# Patient Record
Sex: Female | Born: 1990 | Race: White | Hispanic: No | Marital: Single | Smoking: Never smoker
Health system: Southern US, Community
[De-identification: ages and names within clinical notes are randomized; demographics above are authoritative.]

## PROBLEM LIST (undated history)

## (undated) HISTORY — PX: TYMPANOSTOMY TUBE PLACEMENT: SHX32

---

## 2012-06-10 ENCOUNTER — Emergency Department
Admission: EM | Admit: 2012-06-10 | Discharge: 2012-06-10 | Disposition: A | Discharge status: Home / Self Care | Payer: BC Managed Care – PPO | Source: Home / Self Care | Attending: Family Medicine | Admitting: Family Medicine

## 2012-06-10 ENCOUNTER — Encounter: Payer: Self-pay | Admitting: *Deleted

## 2012-06-10 ENCOUNTER — Emergency Department (INDEPENDENT_AMBULATORY_CARE_PROVIDER_SITE_OTHER): Payer: BC Managed Care – PPO

## 2012-06-10 DIAGNOSIS — R51 Headache: Secondary | ICD-10-CM

## 2012-06-10 MED ORDER — BUTALBITAL-APAP-CAFFEINE 50-325-40 MG PO TABS: 1.0000 | ORAL_TABLET | Freq: Four times a day (QID) | ORAL | Status: DC | PRN

## 2012-06-10 MED ORDER — PROMETHAZINE HCL 25 MG PO TABS: 25.0000 mg | ORAL_TABLET | Freq: Four times a day (QID) | ORAL | Status: AC | PRN

## 2012-06-10 MED ORDER — KETOROLAC TROMETHAMINE 60 MG/2ML IM SOLN
60.0000 mg | Freq: Once | INTRAMUSCULAR | Status: AC
  Administered 2012-06-10: 60 mg via INTRAMUSCULAR

## 2012-06-10 NOTE — ED Provider Notes (Signed)
History     CSN: [id number]  Arrival date & time 06/10/12  1152   First MD Initiated Contact with Patient 06/10/12 1214      Chief Complaint  Patient presents with  . Headache      HPI Comments: Patient c/o occipital HA x 4 days. Reports nausea, decreased appetite and blurred vision previously. Today c/o eye pain. She was seen @ an urgent care and given Amox and 800mg  IBF for ? Sinus infection, last night she went to an ER where she was diagnosed with possible muscle spasm and given valium. She does not report any relief with the valium or 800mg  IBF. HA three days ago was "worst headache of my life".  Patient's last menstrual period was 05/21/2012.  She reports that she has several "sinus headaches" per year, described as nasal congestion and frontal headache without sore throat or URI symptoms. Her mother has migraine headaches.  Patient is a 21 y.o. female presenting with headaches. The history is provided by the patient.  Headache The primary symptoms include headaches. Primary symptoms do not include syncope, loss of consciousness, dizziness, visual change, paresthesias, focal weakness, loss of sensation, speech change, memory loss, fever, nausea or vomiting. Episode onset: 4 days ago. The symptoms are unchanged. The neurological symptoms are focal.  The headache is associated with photophobia and neck stiffness. The headache is not associated with aura, visual change, paresthesias or weakness.  Additional symptoms include neck stiffness, pain, photophobia and anxiety. Additional symptoms do not include weakness, lower back pain, aura or vertigo.    History reviewed. No pertinent past medical history.  Past Surgical History  Procedure Date  . Myringotomy     Family History  Problem Relation Age of Onset  . Asthma Father   . Hypertension Father   . Heart block Other     History  Substance Use Topics  . Smoking status: Never Smoker   . Smokeless tobacco: Not on file  .  Alcohol Use: Yes    OB History    Grav Para Term Preterm Abortions TAB SAB Ect Mult Living                  Review of Systems  Constitutional: Negative for fever.  HENT: Positive for neck stiffness.   Eyes: Positive for photophobia.  Cardiovascular: Negative for syncope.  Gastrointestinal: Negative for nausea and vomiting.  Neurological: Positive for headaches. Negative for dizziness, vertigo, speech change, focal weakness, loss of consciousness, weakness and paresthesias.  Psychiatric/Behavioral: Negative for memory loss.  All other systems reviewed and are negative.    Allergies  Review of patient's allergies indicates no known allergies.  Home Medications   Current Outpatient Rx  Name Route Sig Dispense Refill  . ETONOGESTREL-ETHINYL ESTRADIOL 0.12-0.015 MG/24HR VA RING Vaginal Place 1 each vaginally every 28 (twenty-eight) days. Insert vaginally and leave in place for 3 consecutive weeks, then remove for 1 week.    . IBUPROFEN 800 MG PO TABS Oral Take 800 mg by mouth every 8 (eight) hours as needed.    Marland Kitchen BUTALBITAL-APAP-CAFFEINE 50-325-40 MG PO TABS Oral Take 1-2 tablets by mouth every 6 (six) hours as needed for headache. Do not exceed 6 tabs/24 hours 12 tablet 0  . PROMETHAZINE HCL 25 MG PO TABS Oral Take 1 tablet (25 mg total) by mouth every 6 (six) hours as needed for nausea. 12 tablet 0    BP 133/85  Pulse 82  Temp 98.3 F (36.8 C) (Oral)  Resp 14  Ht 5' (1.524 m)  Wt 149 lb (67.586 kg)  BMI 29.10 kg/m2  SpO2 99%  LMP 05/21/2012  Physical Exam Nursing notes and Vital Signs reviewed. Appearance:  Patient appears healthy, stated age, and in no acute distress Eyes:  Pupils are equal, round, and reactive to light and accomodation.  Extraocular movement is intact.  Conjunctivae are not inflamed.  Fundi are benign  Ears:  Canals normal.  Tympanic membranes normal.  Nose:  Normal turbinates.  No sinus tenderness.   Mouth:  No lesions.  Normal teeth.  Tongue  midline.  Pharynx:  Normal Neck:  Supple.  No adenopathy or thyromegaly.  She has distinct sub-occipital muscle tenderness over trapezius. Lungs:  Clear to auscultation.  Breath sounds are equal.  Heart:  Regular rate and rhythm without murmurs, rubs, or gallops.  Abdomen:  Nontender without masses or hepatosplenomegaly.  Bowel sounds are present.  No CVA or flank tenderness.  Extremities:  No edema.  No calf tenderness Skin:  No rash present.   Neurologic:  Cranial nerves 2 through 12 are normal.  Patellar, achilles, and elbow reflexes are normal.  Cerebellar function is intact (finger-to-nose and rapid alternating hand movement).  Gait and station are normal.    ED Course  Procedures  none   Ct Head Wo Contrast  06/10/2012  *RADIOLOGY REPORT*  Clinical Data: Severe occipital headache.  CT HEAD WITHOUT CONTRAST  Technique:  Contiguous axial images were obtained from the base of the skull through the vertex without contrast.  Comparison: None.  Findings: No acute intracranial abnormality.  Specifically, no hemorrhage, hydrocephalus, mass lesion, acute infarction, or significant intracranial injury.  No acute calvarial abnormality. Visualized paranasal sinuses and mastoids clear.  Orbital soft tissues unremarkable.  IMPRESSION: Normal study.   Original Report Authenticated By: Cyndie Chime, M.D.      1. Occipital headache; suspect tension headache vs mixed vascular headache.  Patient's more frequent "sinus headaches" may actually represent migraines.      MDM  Toradol 60mg  IM.  Rx for Phenergan 25 mg if nausea recurs.  Rx for Fioricet for recurrent headache tomorrow (short term Rx only) Followup PCP for headache management        Lattie Haw, MD 06/10/12 [phone]

## 2012-06-10 NOTE — ED Notes (Signed)
Patient c/o HA x 4 days. Reports nausea, decreased appetite and blurred vision previously. Today c/o eye pain. She was seen @ an urgent care and given Amox and 800mg  IBF for ? Sinus infection, last night she went to an ER where she was diagnosed with possible muscle spasm and given valium. She does not report any relief with the valium or 800mg  IBF. HA on Saturday was "worst headache of my life".

## 2012-06-12 ENCOUNTER — Telehealth: Payer: Self-pay | Admitting: *Deleted

## 2012-06-14 ENCOUNTER — Telehealth: Payer: Self-pay

## 2012-06-14 NOTE — ED Notes (Signed)
Called in refill.   May refill the Fioricet once ----- Message ----- From: Chalmers Cater, CMA Sent: 06/14/2012 10:44 AM To: Lattie Haw, MD Trenae is out of her Fioricet and will not see Dr Linford Arnold until Monday. She states the medication did help with her headaches.

## 2012-06-14 NOTE — Telephone Encounter (Signed)
Message copied by Chalmers Cater on Sat Jun 14, 2012  2:05 PM ------      Message from: Lattie Haw      Created: Sat Jun 14, 2012  2:03 PM       May refill the Fioricet once      ----- Message -----         From: Chalmers Cater, CMA         Sent: 06/14/2012  10:44 AM           To: Lattie Haw, MD            Shannon Meza is out of her Fioricet and will not see Dr Linford Arnold until Monday. She states the medication did help with her headaches.

## 2012-06-14 NOTE — Telephone Encounter (Signed)
Message copied by Chalmers Cater on Sat Jun 14, 2012  2:07 PM ------      Message from: Lattie Haw      Created: Sat Jun 14, 2012  2:03 PM       May refill the Fioricet once      ----- Message -----         From: Chalmers Cater, CMA         Sent: 06/14/2012  10:44 AM           To: Lattie Haw, MD            Rogan is out of her Fioricet and will not see Dr Linford Arnold until Monday. She states the medication did help with her headaches.

## 2012-06-16 ENCOUNTER — Encounter: Payer: Self-pay | Admitting: Family Medicine

## 2012-06-16 ENCOUNTER — Ambulatory Visit (INDEPENDENT_AMBULATORY_CARE_PROVIDER_SITE_OTHER): Payer: BC Managed Care – PPO | Admitting: Family Medicine

## 2012-06-16 VITALS — BP 142/94 | HR 89 | Ht 60.0 in | Wt 152.0 lb

## 2012-06-16 DIAGNOSIS — Z23 Encounter for immunization: Secondary | ICD-10-CM

## 2012-06-16 DIAGNOSIS — G43909 Migraine, unspecified, not intractable, without status migrainosus: Secondary | ICD-10-CM | POA: Insufficient documentation

## 2012-06-16 DIAGNOSIS — F411 Generalized anxiety disorder: Secondary | ICD-10-CM

## 2012-06-16 DIAGNOSIS — F419 Anxiety disorder, unspecified: Secondary | ICD-10-CM

## 2012-06-16 MED ORDER — PROPRANOLOL HCL 40 MG PO TABS: 40.0000 mg | ORAL_TABLET | Freq: Two times a day (BID) | ORAL | Status: DC

## 2012-06-16 NOTE — Patient Instructions (Addendum)
Please bring Headache calendar with you.

## 2012-06-16 NOTE — Progress Notes (Signed)
Subjective:    Patient ID: Shannon Meza, female    DOB: May 29, 1991, 21 y.o.   MRN: [id number]  HPI Here for HA .  Has had sinus HA before. Says this last HA felt different.  Thsi last HA lasted for a week and today does feel bettte.r HA was mostly in the back of her head. Says felt like a stabbing sensation.  No light sensitivity. Some nauseated and no severe.  Rated her HA around a 10.  Tried IBU and sometimes helps.  Does have recurrent sinus infections. Had CT done in the UC that was neg for infection of the sinuses.  The week before had been to a different UC. And told had pharyngitis and fluid in her ears.  She is getting HA about 3 times per week. She says she feels really stressed. She puts a lot of pressure on herself. She is in school and works a full time job.  Sleeps well. She drinks a lot of soda.  She denies any type of aura with her headaches. She does report a lot of sinus headaches and sinus infections.  Given fiorect and it did help.     Review of Systems  Constitutional: Negative for fever, diaphoresis and unexpected weight change.  HENT: Positive for sinus pressure. Negative for hearing loss, rhinorrhea and tinnitus.   Eyes: Negative for visual disturbance.  Respiratory: Negative for cough and wheezing.   Cardiovascular: Negative for chest pain and palpitations.  Gastrointestinal: Negative for nausea, vomiting, diarrhea and blood in stool.  Genitourinary: Negative for vaginal bleeding, vaginal discharge and difficulty urinating.  Musculoskeletal: Negative for myalgias and arthralgias.  Skin: Negative for rash.  Neurological: Negative for headaches.  Hematological: Negative for adenopathy. Does not bruise/bleed easily.  Psychiatric/Behavioral: Negative for disturbed wake/sleep cycle and dysphoric mood. The patient is nervous/anxious.        BP 142/94  Pulse 89  Ht 5' (1.524 m)  Wt 152 lb (68.947 kg)  BMI 29.69 kg/m2  LMP 05/21/2012    No Known Allergies  No  past medical history on file.  Past Surgical History  Procedure Date  . Tympanostomy tube placement 13 months    History   Social History  . Marital Status: Single    Spouse Name: N/A    Number of Children: N/A  . Years of Education: N/A   Occupational History  . Sales     [employer]    Social History Main Topics  . Smoking status: Never Smoker   . Smokeless tobacco: Not on file  . Alcohol Use: Yes  . Drug Use: No  . Sexually Active: Yes -- Female partner(s)   Other Topics Concern  . Not on file   Social History Narrative   3 caffeine drinks per day. No exercise. She currently lives with her boyfriend Garlon Hatchet.      Family History  Problem Relation Age of Onset  . Asthma Father   . Hypertension Father   . Heart block Other   . Parkinsonism    . Colon cancer    . Hyperlipidemia Mother   . Migraines Mother     Outpatient Encounter Prescriptions as of 06/16/2012  Medication Sig Dispense Refill  . butalbital-acetaminophen-caffeine (FIORICET) 50-325-40 MG per tablet Take 1-2 tablets by mouth every 6 (six) hours as needed for headache. Do not exceed 6 tabs/24 hours  12 tablet  0  . etonogestrel-ethinyl estradiol (NUVARING) 0.12-0.015 MG/24HR vaginal ring Place 1 each vaginally every 28 (twenty-eight)  days. Insert vaginally and leave in place for 3 consecutive weeks, then remove for 1 week.      Marland Kitchen ibuprofen (ADVIL,MOTRIN) 800 MG tablet Take 800 mg by mouth every 8 (eight) hours as needed.      . promethazine (PHENERGAN) 25 MG tablet Take 1 tablet (25 mg total) by mouth every 6 (six) hours as needed for nausea.  12 tablet  0  . propranolol (INDERAL) 40 MG tablet Take 1 tablet (40 mg total) by mouth 2 (two) times daily.  60 tablet  0       Objective:   Physical Exam  Constitutional: She is oriented to person, place, and time. She appears well-developed and well-nourished.  HENT:  Head: Normocephalic and atraumatic.  Cardiovascular: Normal rate, regular  rhythm and normal heart sounds.   Pulmonary/Chest: Effort normal and breath sounds normal.  Neurological: She is alert and oriented to person, place, and time. No cranial nerve deficit.  Skin: Skin is warm and dry.  Psychiatric: She has a normal mood and affect. Her behavior is normal.          Assessment & Plan:  Headaches-discussed that based on her description of her sxs and the fact she had a normal CT scan of the head I think she actually does have migraines. There's also a positive family history. We discussed that she does not have an aura but the mainstay of therapy is to treat him acutely and quickly when they first come on to try to break the headache cycle. We also discussed because she has headaches at least 3 times per week starting her on something prophylactic. I think a beta blocker would be perfect because her blood pressure is elevated today and has been elevated the last couple times she has sought medical care. We will start with propranolol twice a day and I would like to see her back in 6-8 weeks. I encouraged her to keep a headache diary and list the days that she has headaches and whether not they are more mild more severe and twisted she took a medication to help treat them. Including ibuprofen, the Fioricet prescription or nothing. I did refill her Fioricet today. We also discussed the importance of getting plenty of rest and cutting out the caffeine in her diet. She drinks a large number of centers on daily basis and we discussed weaning this down slowly so that she does not get withdrawal headaches. Also discussed avoiding foods such as wine, aged cheeses, nuts, caffeine, and chocolate.  Anxiety-she feels her anxiety levels true contribute greatly to her headaches. We may need to work this up as a separate issue. She doesn't strike me as a very anxious person on exam today. I do think she puts herself on a lot of pressure and stress and we may need to work on that. She seems  to be sleeping well. I will have her fill out a Johnson anxiety disorder questionnaire at the followup visit.  Tdap and flu vaccines given today.

## 2012-07-29 ENCOUNTER — Ambulatory Visit (INDEPENDENT_AMBULATORY_CARE_PROVIDER_SITE_OTHER): Payer: BC Managed Care – PPO | Admitting: Family Medicine

## 2012-07-29 ENCOUNTER — Encounter: Payer: Self-pay | Admitting: Family Medicine

## 2012-07-29 VITALS — BP 136/93 | HR 96 | Ht 60.0 in | Wt 151.0 lb

## 2012-07-29 DIAGNOSIS — R030 Elevated blood-pressure reading, without diagnosis of hypertension: Secondary | ICD-10-CM

## 2012-07-29 DIAGNOSIS — G43009 Migraine without aura, not intractable, without status migrainosus: Secondary | ICD-10-CM

## 2012-07-29 MED ORDER — IBUPROFEN 800 MG PO TABS: 800.0000 mg | ORAL_TABLET | Freq: Three times a day (TID) | ORAL | Status: AC | PRN

## 2012-07-29 MED ORDER — PROPRANOLOL HCL 40 MG PO TABS: 40.0000 mg | ORAL_TABLET | Freq: Two times a day (BID) | ORAL | Status: AC

## 2012-07-29 MED ORDER — BUTALBITAL-APAP-CAFFEINE 50-325-40 MG PO TABS: 1.0000 | ORAL_TABLET | Freq: Four times a day (QID) | ORAL | Status: AC | PRN

## 2012-07-29 NOTE — Progress Notes (Signed)
  Subjective:    Patient ID: Shannon Meza, female    DOB: [birth year], 21 y.o.   MRN: [id number]  HPI Headache.  She is doing much better. Tolerating propranolol well.  Ran out about 5 days ago. HA went from 2-3 a week to 1 a week. She is happy with the progress. Says has really tried ot decrease her soda, she is still drinking one a day. Hasn't used fiorecet but has used ibuprofen and worked well. Hasn't needed the phenergan.    Review of Systems     Objective:   Physical Exam  Constitutional: She is oriented to person, place, and time. She appears well-developed and well-nourished.  HENT:  Head: Normocephalic and atraumatic.  Cardiovascular: Normal rate, regular rhythm and normal heart sounds.   Pulmonary/Chest: Effort normal and breath sounds normal.  Neurological: She is alert and oriented to person, place, and time.  Skin: Skin is warm and dry.  Psychiatric: She has a normal mood and affect. Her behavior is normal.          Assessment & Plan:  Migraine headache-she is doing fantastic. Her headaches are down from 2-3 per week to one per week. This is great. I like to get her down to 1 or less per month. We will go ahead and refill the propranolol and follow up in 2 months. Did not adjust her dose that she will likely get continued benefit the longer she is on the medication. So far she's tolerating it well. Continue to work on [employer], regular exercise and minimizing caffeine. Unfortunately she says the pharmacy never got the Fioricet prescription. I printed and handed to her today. Also refilled the ibuprofen which is what she has been using for acute episodes.  Elevated blood pressure-her blood pressure is high again today. She's been off the medication for about 5 days. I suspect that when she takes her blood pressure looks well controlled. We will see her followup in 2 months. If not then we can increase the medication.

## 2013-03-07 IMAGING — CT CT HEAD W/O CM
2 series · 16 of 30 positions shown, 20 images · non-contrast
Comparison: None.

CLINICAL DATA: Severe occipital headache.

CT HEAD WITHOUT CONTRAST
TECHNIQUE: Contiguous axial images were obtained from the base of
the skull through the vertex without contrast.

[Series 2: head w/o · axial · non-contrast · 0.49mm/px · z∈[+24,+147]mm · 13 of 28 slices shown, 17 images]
[im 2/28  brain]
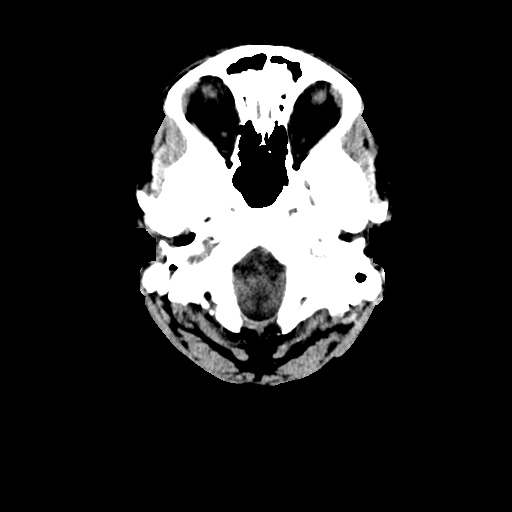
[im 2/28  bone]
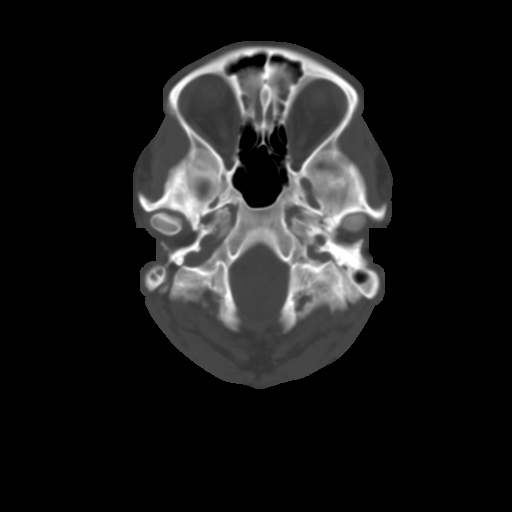
[im 4/28  brain]
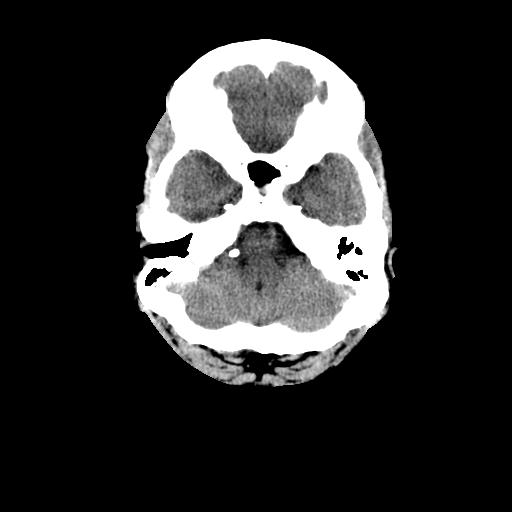
[im 6/28  brain]
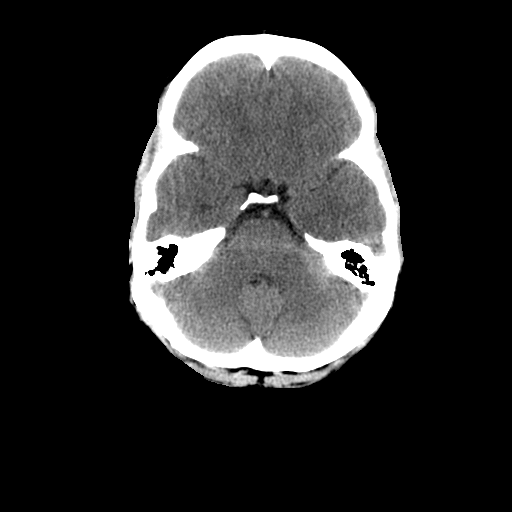
[im 8/28  brain]
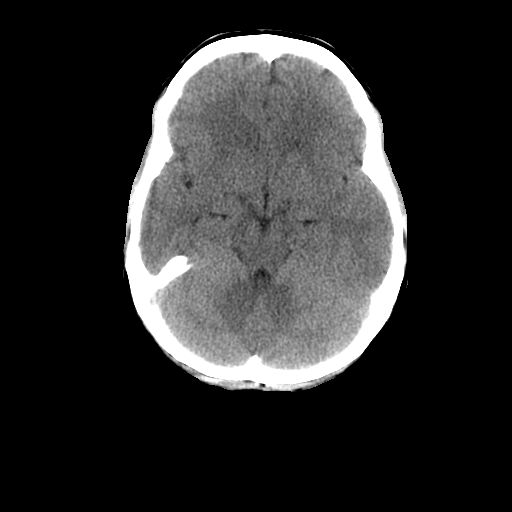
[im 10/28  brain]
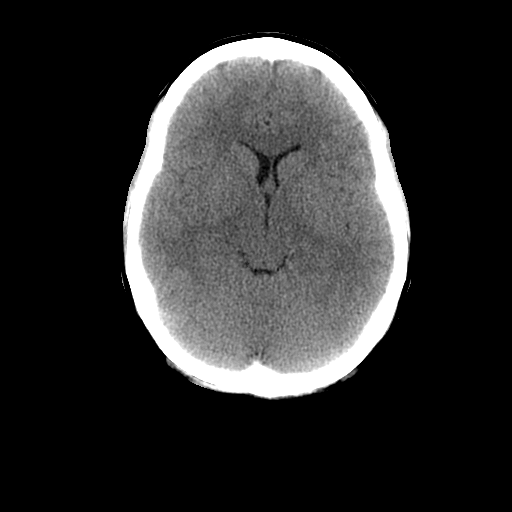
[im 10/28  bone]
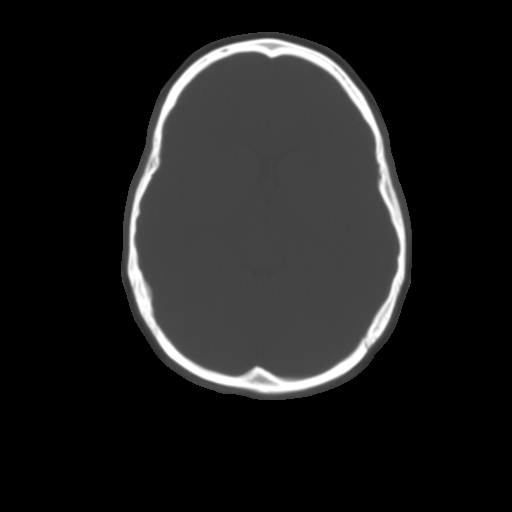
[im 12/28  brain]
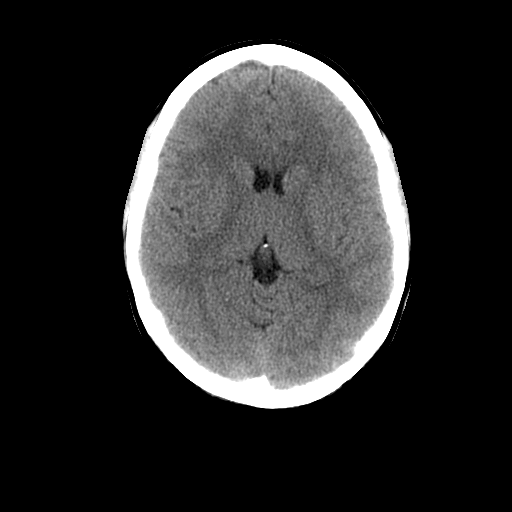
[im 14/28  brain]
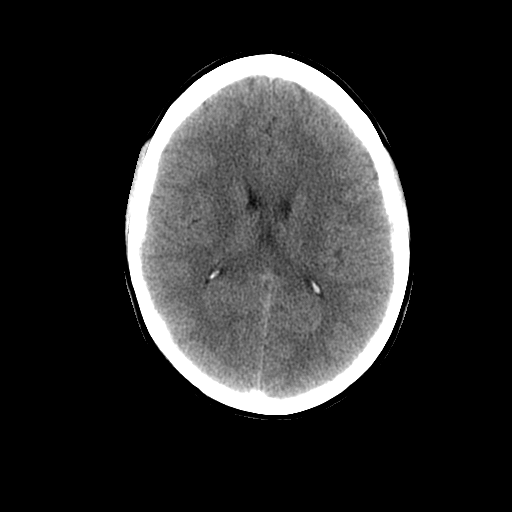
[im 16/28  brain]
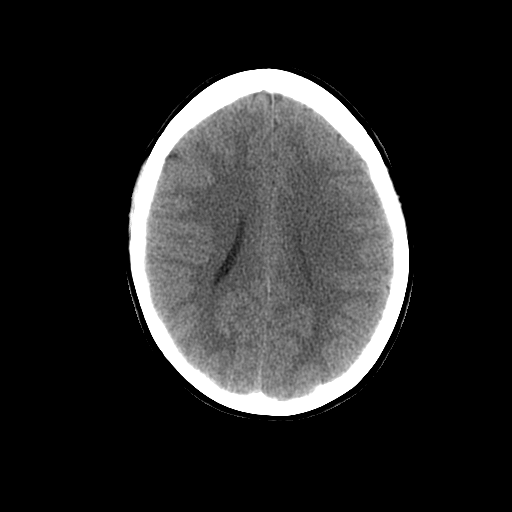
[im 18/28  brain]
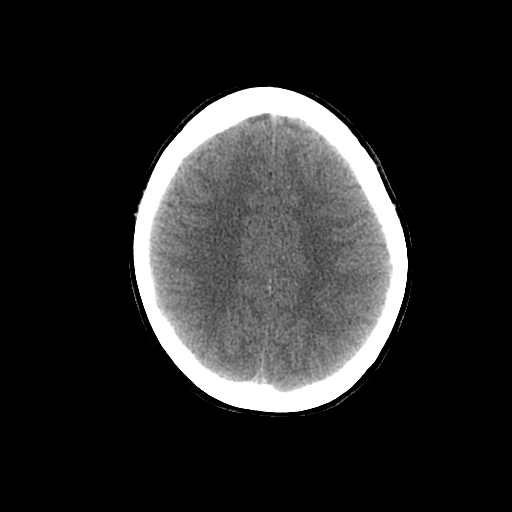
[im 18/28  bone]
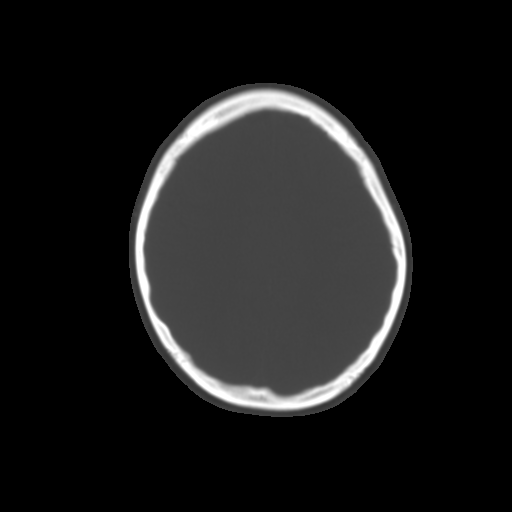
[im 20/28  brain]
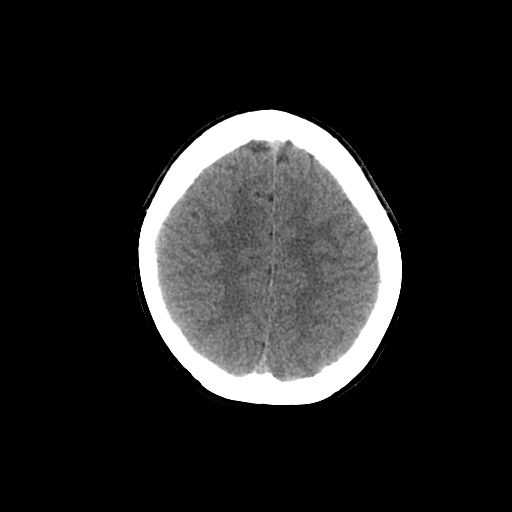
[im 22/28  brain]
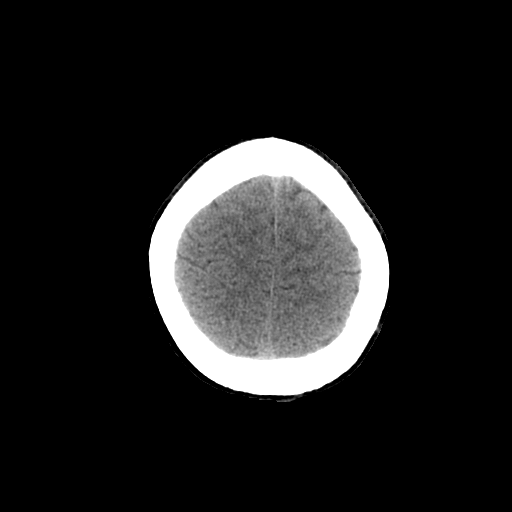
[im 24/28  brain]
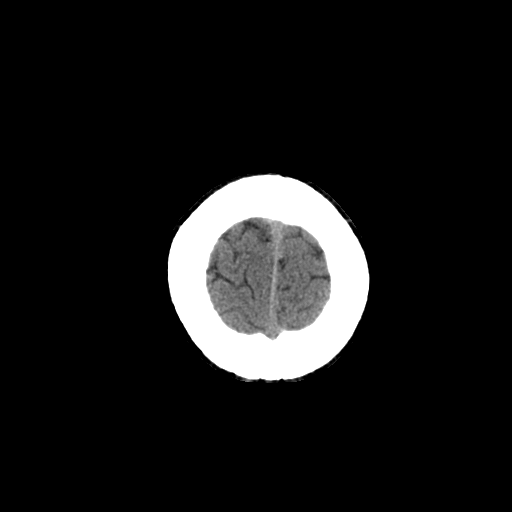
[im 26/28  brain]
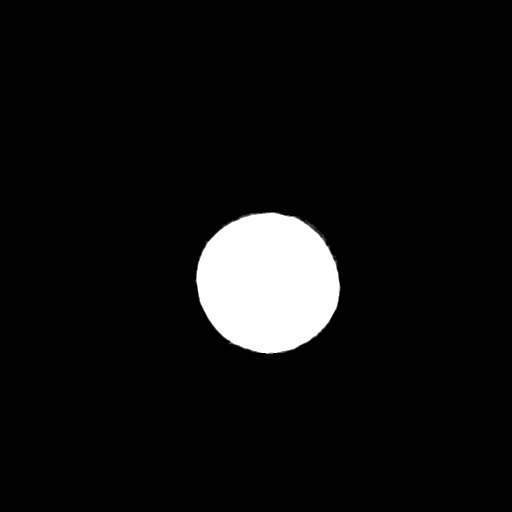
[im 26/28  bone]
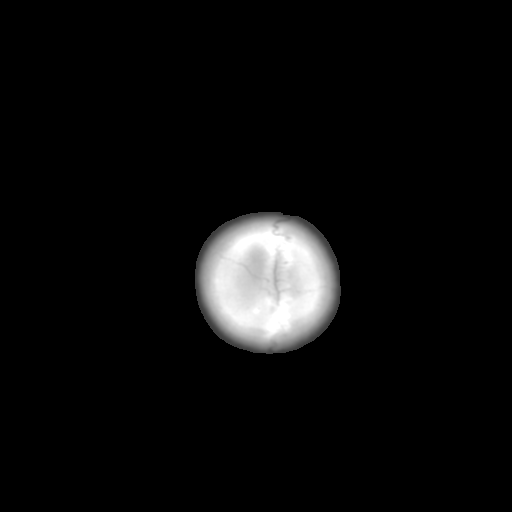

[Series 3: head bone · axial · 0.49mm/px · z∈[+24,+65]mm · 3 of 28 slices shown]
[im 2/28  bone]
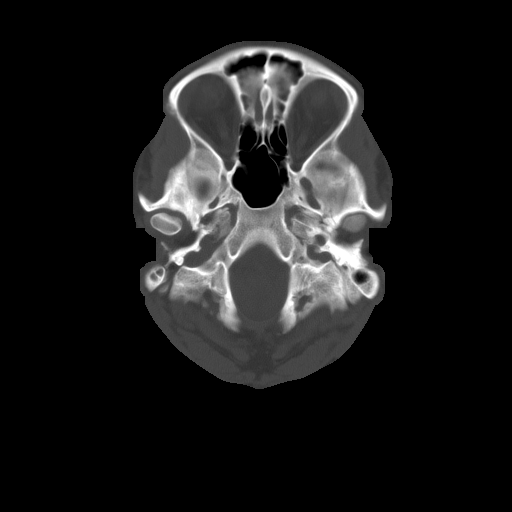
[im 6/28  bone]
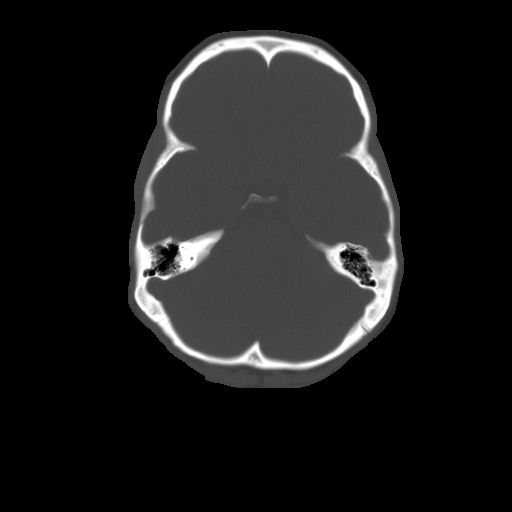
[im 10/28  bone]
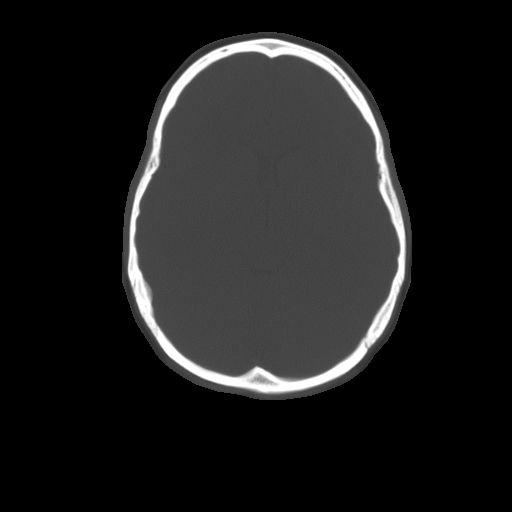

[16 of 30 positions shown; findings below may reference images not displayed]

FINDINGS: No acute intracranial abnormality.  Specifically, no
hemorrhage, hydrocephalus, mass lesion, acute infarction, or
significant intracranial injury.  No acute calvarial abnormality.
Visualized paranasal sinuses and mastoids clear.  Orbital soft
tissues unremarkable.
IMPRESSION: Normal study.

## 2014-11-10 ENCOUNTER — Other Ambulatory Visit: Payer: Self-pay | Admitting: Physician Assistant

## 2014-11-10 ENCOUNTER — Ambulatory Visit (INDEPENDENT_AMBULATORY_CARE_PROVIDER_SITE_OTHER): Payer: BLUE CROSS/BLUE SHIELD | Admitting: Physician Assistant

## 2014-11-10 ENCOUNTER — Ambulatory Visit (HOSPITAL_BASED_OUTPATIENT_CLINIC_OR_DEPARTMENT_OTHER): Payer: BLUE CROSS/BLUE SHIELD

## 2014-11-10 ENCOUNTER — Ambulatory Visit (INDEPENDENT_AMBULATORY_CARE_PROVIDER_SITE_OTHER): Payer: BLUE CROSS/BLUE SHIELD

## 2014-11-10 VITALS — BP 136/88 | HR 88 | Wt 142.0 lb

## 2014-11-10 DIAGNOSIS — R109 Unspecified abdominal pain: Secondary | ICD-10-CM | POA: Diagnosis not present

## 2014-11-10 DIAGNOSIS — R1084 Generalized abdominal pain: Secondary | ICD-10-CM

## 2014-11-10 DIAGNOSIS — R112 Nausea with vomiting, unspecified: Secondary | ICD-10-CM

## 2014-11-13 ENCOUNTER — Encounter: Payer: Self-pay | Admitting: Physician Assistant

## 2014-11-13 DIAGNOSIS — R109 Unspecified abdominal pain: Secondary | ICD-10-CM | POA: Insufficient documentation

## 2014-11-13 DIAGNOSIS — R112 Nausea with vomiting, unspecified: Secondary | ICD-10-CM | POA: Insufficient documentation

## 2014-11-13 NOTE — Progress Notes (Signed)
   Subjective:    Patient ID: Patrecia PaceJennifer HAYLEY Giese, female    DOB: [birth year], 24 y.o.   MRN: [id number]  HPI Pt presents to the clinic to follow up from ED visit on 11/04/2014. On a Thursday night severe abdominal pain started. [facility]. No scans were done. She was diagnosed with gastritis and given omeprazole to start daily. She continues to have nausea and epigastric tenderness. Denies any active reflux. Denies possiblity of being pregnant. She does feel like foods can be a trigger. No fever, chills, SOB or back pain. She did vomit once after eating on Monday after ER visit. Labs at ED were all normal. No urinary symptoms or problems.   She was also treated with levaquin for sinusitis. Still having a lot sinus pressure, ear pain.    Review of Systems  All other systems reviewed and are negative.      Objective:   Physical Exam  Constitutional: She is oriented to person, place, and time. She appears well-developed and well-nourished.  HENT:  Head: Normocephalic and atraumatic.  Cardiovascular: Normal rate, regular rhythm and normal heart sounds.   Pulmonary/Chest: Effort normal and breath sounds normal.  Abdominal: Soft. Bowel sounds are normal. She exhibits no distension and no mass. There is no rebound and no guarding.  No rebound or guarding. Mild diffuse epigastric tenderness and RUQ tendneress.   Neurological: She is alert and oriented to person, place, and time.  Skin: Skin is dry.  Psychiatric: She has a normal mood and affect. Her behavior is normal.          Assessment & Plan:  Nausea/generalized abdominal pain- will get complete abdominal U/S. Phenergan given for nausea. Continue omeprazole for acid suppression. Follow up if symptoms persist.   Sinusitis- treated with prednisone for 5 days. Power out prescription written out.

## 2014-11-29 ENCOUNTER — Ambulatory Visit (INDEPENDENT_AMBULATORY_CARE_PROVIDER_SITE_OTHER): Payer: BLUE CROSS/BLUE SHIELD | Admitting: Family Medicine

## 2014-11-29 VITALS — BP 122/82 | HR 108 | Ht 60.0 in | Wt 142.0 lb

## 2014-11-29 DIAGNOSIS — R1084 Generalized abdominal pain: Secondary | ICD-10-CM

## 2014-11-29 NOTE — Progress Notes (Signed)
   Subjective:    Patient ID: Shannon PaceJennifer HAYLEY Gaby, female    DOB: [birth year], 24 y.o.   MRN: [id number]  HPI Patient came into office today for H. Pylori breath test. Patient states she has been NPO this am, and has had no gum this am either.   Review of Systems     Objective:   Physical Exam        Assessment & Plan:  Patient able to breath into baseline bag, then drank solution. After allotted timeframe, Pt provided second breath sample. Pt advised we will contact her with the results. No further questions at this time.  Agree with above. Sample sent to lab.  Nani Gasseratherine Metheney, MD

## 2014-11-30 LAB — H. PYLORI BREATH TEST: H. pylori Breath Test: NOT DETECTED

## 2015-08-07 IMAGING — US US ABDOMEN COMPLETE
1 series · 14 of 25 positions shown · non-contrast
Comparison: None.

CLINICAL DATA: Epigastric pain.  Nausea and vomiting.

EXAM:
ULTRASOUND ABDOMEN COMPLETE

[Series 1: us abdomen complete · 0.24mm/px · 14 of 77 slices shown]
[im 1/77]
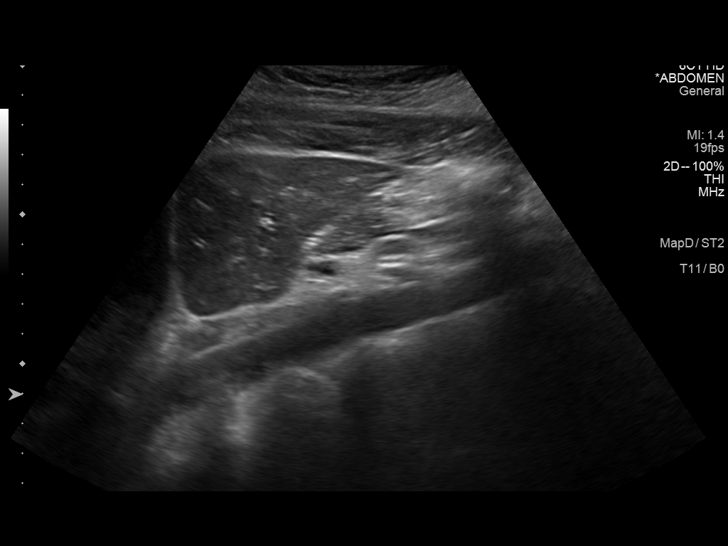
[im 7/77]
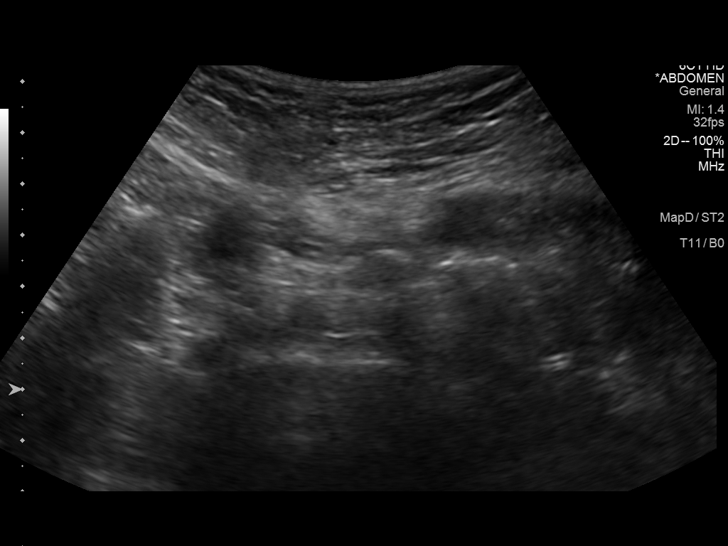
[im 13/77]
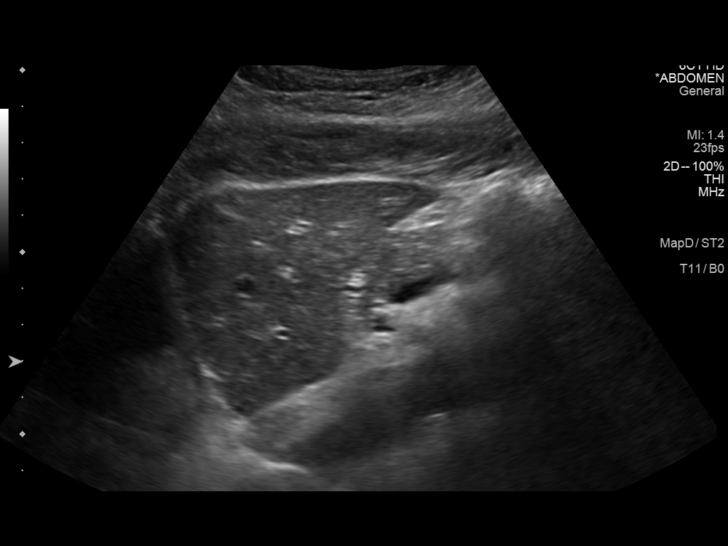
[im 20/77]
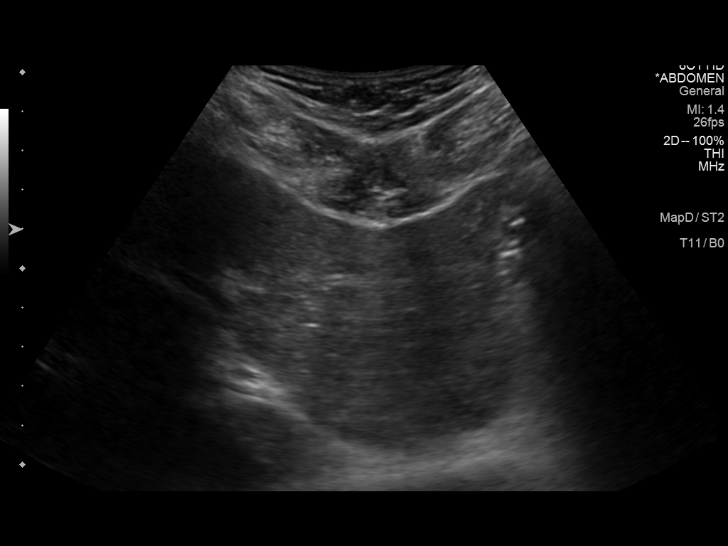
[im 26/77]
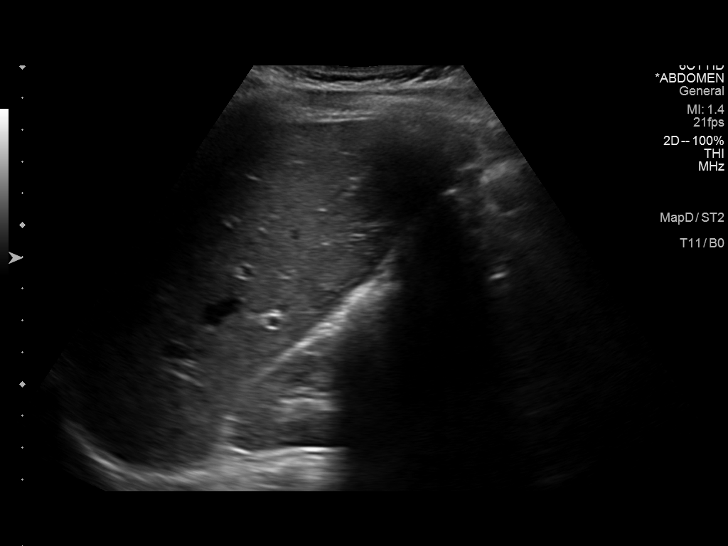
[im 29/77]
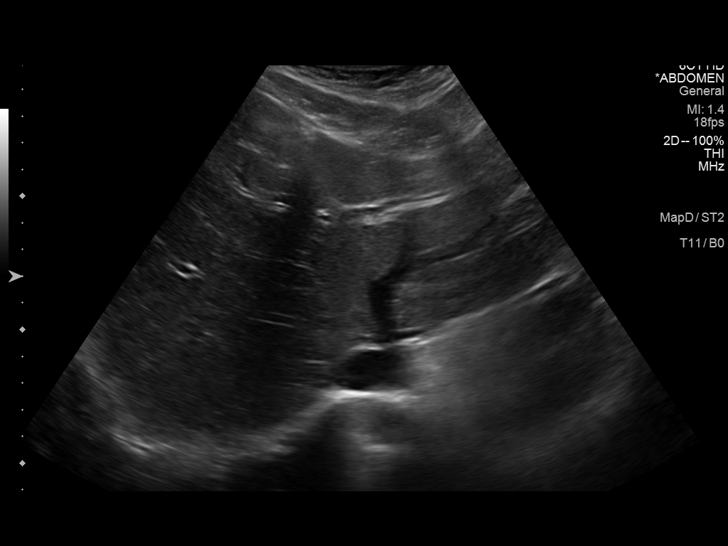
[im 35/77]
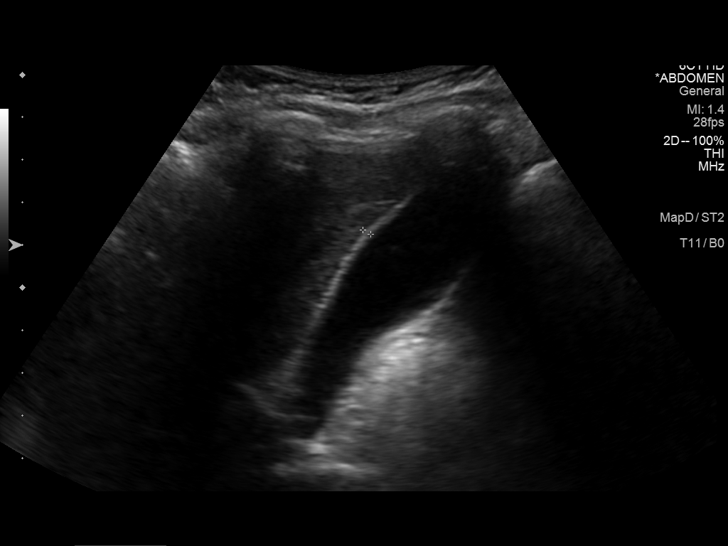
[im 42/77]
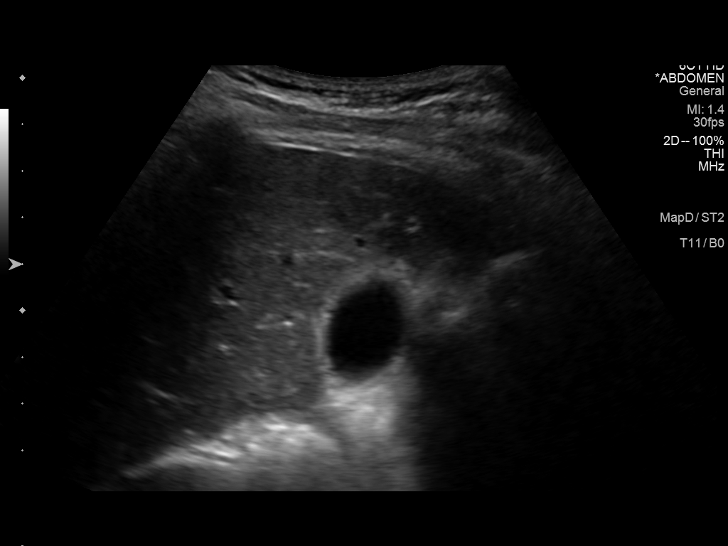
[im 48/77]
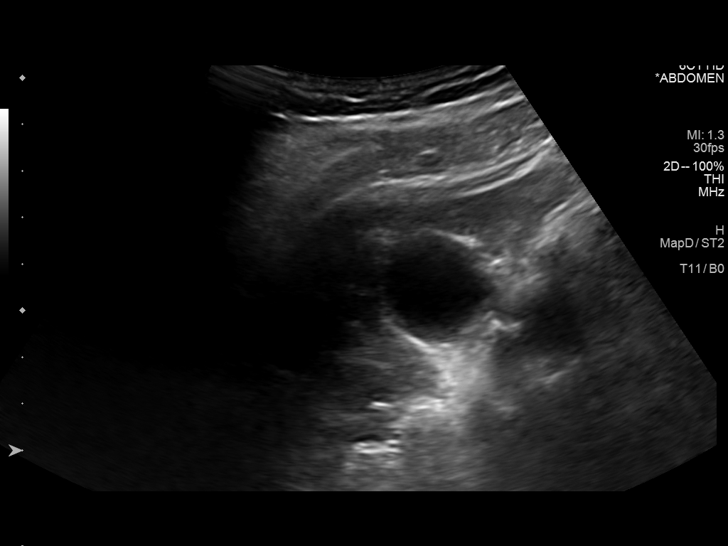
[im 51/77]
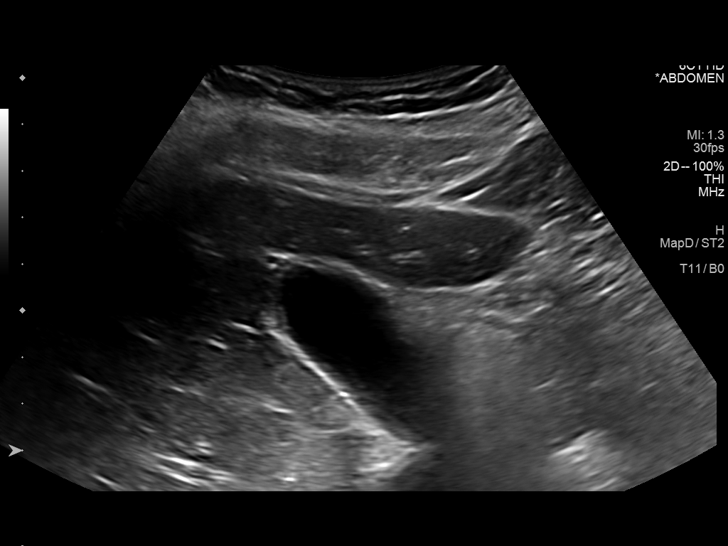
[im 58/77]
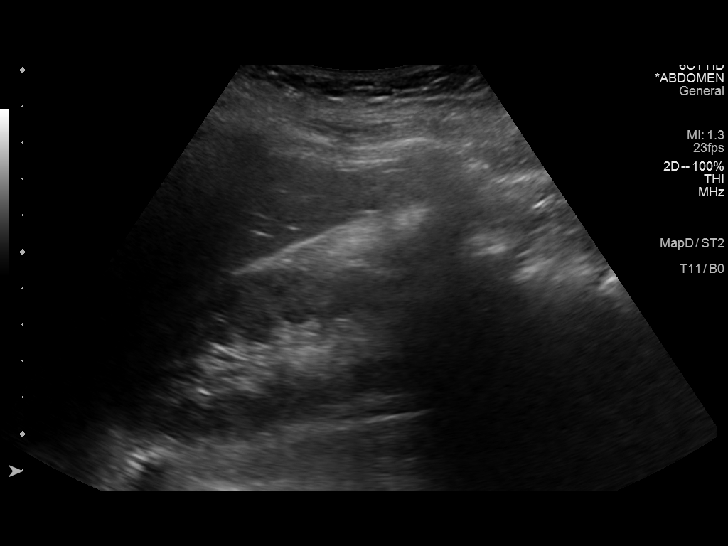
[im 64/77]
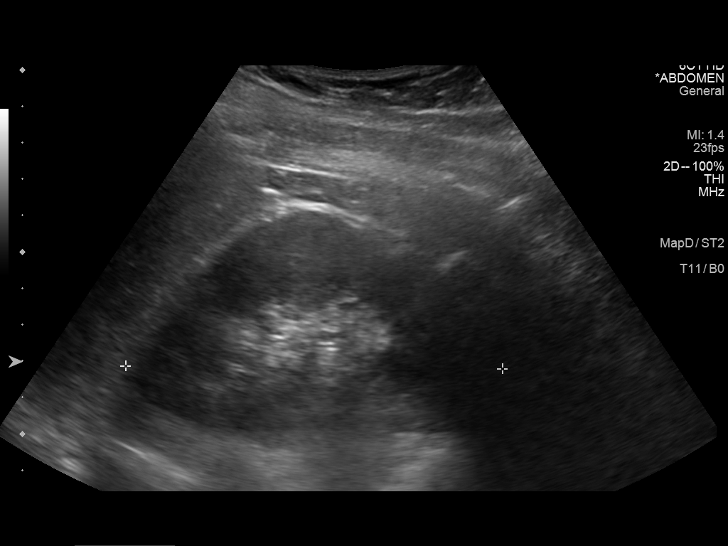
[im 70/77]
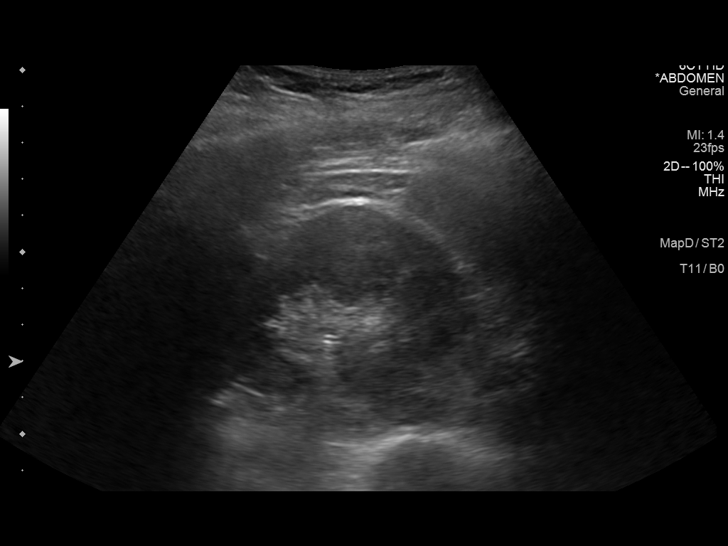
[im 77/77]
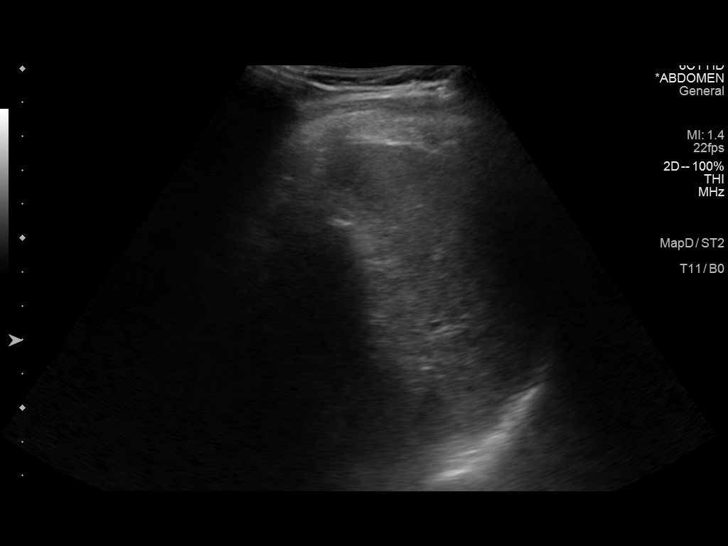

[14 of 25 positions shown; findings below may reference images not displayed]

FINDINGS: Gallbladder: No gallstones or wall thickening visualized. No
sonographic Murphy sign noted.

Common bile duct: Diameter: 2.7 mm

Liver: No focal lesion identified. Within normal limits in
parenchymal echogenicity.

IVC: No abnormality visualized.

Pancreas: Visualized portion unremarkable.

Spleen: Size and appearance within normal limits.

Right Kidney: Length: 10.0 cm. Echogenicity within normal limits. No
mass or hydronephrosis visualized.

Left Kidney: Length: 10.4 cm. Echogenicity within normal limits. No
mass or hydronephrosis visualized.

Abdominal aorta: No aneurysm visualized.

Other findings: Limited exam due to overlying bowel gas.
IMPRESSION: Normal exam.
